# Patient Record
Sex: Female | Born: 2000 | Race: Black or African American | Hispanic: No | Marital: Single | Smoking: Current some day smoker
Health system: Southern US, Community
[De-identification: ages and names within clinical notes are randomized; demographics above are authoritative.]

---
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
## 2020-03-08 ENCOUNTER — Ambulatory Visit: Payer: Medicaid Other | Admitting: Physician Assistant

## 2020-03-08 ENCOUNTER — Other Ambulatory Visit: Payer: Self-pay

## 2020-03-08 DIAGNOSIS — Z113 Encounter for screening for infections with a predominantly sexual mode of transmission: Secondary | ICD-10-CM

## 2020-03-08 LAB — WET PREP FOR TRICH, YEAST, CLUE
Trichomonas Exam: NEGATIVE
Yeast Exam: NEGATIVE

## 2020-03-08 NOTE — Progress Notes (Signed)
Wet mount reviewed by provider, no tx; posted by provider.

## 2020-03-10 ENCOUNTER — Encounter: Payer: Self-pay | Admitting: Physician Assistant

## 2020-03-10 NOTE — Progress Notes (Signed)
  [facility] Department STI clinic/screening visit  Subjective:  Brianna Cortez is a 19 y.o. female being seen today for an STI screening visit. The patient reports they do not have symptoms.  Patient reports that they do not desire a pregnancy in the next year.   They reported they are not interested in discussing contraception today.  No LMP recorded. Patient is premenarcheal.   Patient has the following medical conditions:  There are no problems to display for this patient.   Chief Complaint  Patient presents with  . SEXUALLY TRANSMITTED DISEASE    STD screening including bloodwork    HPI  Patient reports that she is not having any symptoms but would like a screening today.  Denies chronic conditions, surgeries and regular medications. No previous pap due to patient not yet 19 yo.  Per patient, last HIV test was 1 year ago.   LMP 02/08/2020 and using condoms as BCM.    See flowsheet for further details and programmatic requirements.    The following portions of the patient's history were reviewed and updated as appropriate: allergies, current medications, past medical history, past social history, past surgical history and problem list.  Objective:  There were no vitals filed for this visit.  Physical Exam Constitutional:      General: She is not in acute distress.    Appearance: Normal appearance.  HENT:     Head: Normocephalic and atraumatic.     Comments: No nits, lice or hair loss. No cervical, supraclavicular or axillary adenopathy.    Mouth/Throat:     Mouth: Mucous membranes are moist.     Pharynx: Oropharynx is clear. No oropharyngeal exudate or posterior oropharyngeal erythema.  Eyes:     Conjunctiva/sclera: Conjunctivae normal.  Pulmonary:     Effort: Pulmonary effort is normal.  Abdominal:     Palpations: Abdomen is soft. There is no mass.     Tenderness: There is no abdominal tenderness. There is no guarding or rebound.  Musculoskeletal:      Cervical back: Neck supple. No tenderness.  Skin:    General: Skin is warm and dry.     Findings: No bruising, erythema, lesion or rash.  Neurological:     Mental Status: She is alert and oriented to person, place, and time.  Psychiatric:        Mood and Affect: Mood normal.        Behavior: Behavior normal.        Thought Content: Thought content normal.        Judgment: Judgment normal.    Patient opted to self-collect samples for GC/Chlamydia and wet mount today.  Assessment and Plan:  Brianna Cortez is a 19 y.o. female presenting to the [facility] Department for STI screening  1. Screening for STD (sexually transmitted disease) Patient into clinic without symptoms. Reviewed wet mount results with patient and no treatment is indicated today.  Rec condoms with all sex. Await test results.  Counseled that RN will call if needs to RTC for treatment once results are back. - WET PREP FOR TRICH, YEAST, CLUE - Gonococcus culture - Chlamydia/Gonorrhea [place] [facility] - HIV [place] [facility] - Syphilis Serology, [place] [facility]     No follow-ups on file.  No future appointments.  Matt Holmes, PA

## 2020-03-13 LAB — GONOCOCCUS CULTURE

## 2020-11-13 ENCOUNTER — Encounter: Payer: Self-pay | Admitting: Physician Assistant

## 2020-11-13 ENCOUNTER — Other Ambulatory Visit: Payer: Self-pay

## 2020-11-13 ENCOUNTER — Ambulatory Visit: Payer: Medicaid Other | Admitting: Physician Assistant

## 2020-11-13 DIAGNOSIS — Z113 Encounter for screening for infections with a predominantly sexual mode of transmission: Secondary | ICD-10-CM | POA: Diagnosis not present

## 2020-11-13 LAB — WET PREP FOR TRICH, YEAST, CLUE
Trichomonas Exam: NEGATIVE
Yeast Exam: NEGATIVE

## 2020-11-13 NOTE — Progress Notes (Signed)
Wet mount reviewed, no tx per provider orders. Provider orders completed. 

## 2020-11-13 NOTE — Progress Notes (Signed)
  [facility] Department STI clinic/screening visit  Subjective:  Brianna Cortez is a 20 y.o. female being seen today for an STI screening visit. The patient reports they do not have symptoms.  Patient reports that they do not desire a pregnancy in the next year.   They reported they are not interested in discussing contraception today.  LMP 11/09/2020.  Patient has the following medical conditions:  There are no problems to display for this patient.   Chief Complaint  Patient presents with  . SEXUALLY TRANSMITTED DISEASE    screening    HPI  Patient reports that she is not having any symptoms but would like a screening today.  Denies chronic conditions, surgeries and regular medicines.  States last HIV test was in 2021 and she has not had a pap due to not yet being 20 years old.   See flowsheet for further details and programmatic requirements.    The following portions of the patient's history were reviewed and updated as appropriate: allergies, current medications, past medical history, past social history, past surgical history and problem list.  Objective:  There were no vitals filed for this visit.  Physical Exam Constitutional:      General: She is not in acute distress.    Appearance: Normal appearance.  HENT:     Head: Normocephalic and atraumatic.     Comments: No nits,lice, or hair loss. No cervical, supraclavicular or axillary adenopathy.    Mouth/Throat:     Mouth: Mucous membranes are moist.     Pharynx: Oropharynx is clear. No oropharyngeal exudate or posterior oropharyngeal erythema.  Eyes:     Conjunctiva/sclera: Conjunctivae normal.  Pulmonary:     Effort: Pulmonary effort is normal.  Abdominal:     Palpations: Abdomen is soft. There is no mass.     Tenderness: There is no abdominal tenderness. There is no guarding or rebound.  Genitourinary:    General: Normal vulva.     Rectum: Normal.     Comments: External genitalia/pubic area  without nits, lice, edema, erythema, lesions and inguinal adenopathy. Vagina with normal mucosa and a small amount of pinkish menstrual bleeding present. Cervix without visible lesions. Uterus firm, mobile, nt, no masses, no CMT, no adnexal tenderness or fullness. Musculoskeletal:     Cervical back: Neck supple. No tenderness.  Skin:    General: Skin is warm and dry.     Findings: No bruising, erythema, lesion or rash.  Neurological:     Mental Status: She is alert and oriented to person, place, and time.  Psychiatric:        Mood and Affect: Mood normal.        Behavior: Behavior normal.        Thought Content: Thought content normal.        Judgment: Judgment normal.      Assessment and Plan:  [name] is a 20 y.o. female presenting to the [facility] Department for STI screening  1. Screening for STD (sexually transmitted disease) Patient into clinic without symptoms. Rec condoms with all sex. Await test results.  Counseled that RN will call if needs to RTC for treatment once results are back. - WET PREP FOR TRICH, YEAST, CLUE - Gonococcus culture - Chlamydia/Gonorrhea [place] [facility] - HIV [place] [facility] - Syphilis Serology, [place] [facility]     No follow-ups on file.  No future appointments.  Matt Holmes, PA

## 2020-11-18 LAB — GONOCOCCUS CULTURE
